# Patient Record
Sex: Male | Born: 1965 | Hispanic: No | Marital: Single | Smoking: Never smoker
Health system: Southern US, Community
[De-identification: ages and names within clinical notes are randomized; demographics above are authoritative.]

## PROBLEM LIST (undated history)

## (undated) DIAGNOSIS — E785 Hyperlipidemia, unspecified: Secondary | ICD-10-CM

## (undated) DIAGNOSIS — R079 Chest pain, unspecified: Secondary | ICD-10-CM

## (undated) DIAGNOSIS — N200 Calculus of kidney: Secondary | ICD-10-CM

## (undated) HISTORY — DX: Chest pain, unspecified: R07.9

## (undated) HISTORY — PX: NO PAST SURGERIES: SHX2092

---

## 2009-08-27 ENCOUNTER — Emergency Department (HOSPITAL_COMMUNITY): Admission: EM | Admit: 2009-08-27 | Discharge: 2009-08-27 | Payer: Self-pay | Admitting: Emergency Medicine

## 2010-07-21 LAB — URINALYSIS, ROUTINE W REFLEX MICROSCOPIC
Bilirubin Urine: NEGATIVE
Hgb urine dipstick: NEGATIVE
Nitrite: NEGATIVE
Urobilinogen, UA: 0.2 mg/dL (ref 0.0–1.0)

## 2010-07-21 LAB — URINE CULTURE: Colony Count: NO GROWTH

## 2012-02-11 ENCOUNTER — Encounter (HOSPITAL_COMMUNITY): Payer: Self-pay | Admitting: Emergency Medicine

## 2012-02-11 ENCOUNTER — Emergency Department (HOSPITAL_COMMUNITY)
Admission: EM | Admit: 2012-02-11 | Discharge: 2012-02-12 | Disposition: A | Discharge status: Home / Self Care | Payer: 59 | Attending: Emergency Medicine | Admitting: Emergency Medicine

## 2012-02-11 DIAGNOSIS — R109 Unspecified abdominal pain: Secondary | ICD-10-CM | POA: Insufficient documentation

## 2012-02-11 DIAGNOSIS — N210 Calculus in bladder: Secondary | ICD-10-CM | POA: Insufficient documentation

## 2012-02-11 DIAGNOSIS — R319 Hematuria, unspecified: Secondary | ICD-10-CM | POA: Insufficient documentation

## 2012-02-11 DIAGNOSIS — R112 Nausea with vomiting, unspecified: Secondary | ICD-10-CM | POA: Insufficient documentation

## 2012-02-11 DIAGNOSIS — N23 Unspecified renal colic: Secondary | ICD-10-CM

## 2012-02-11 LAB — COMPREHENSIVE METABOLIC PANEL
Albumin: 3.3 g/dL — ABNORMAL LOW (ref 3.5–5.2)
CO2: 28 mEq/L (ref 19–32)
Calcium: 8.5 mg/dL (ref 8.4–10.5)
GFR calc Af Amer: 83 mL/min — ABNORMAL LOW (ref >90)
Potassium: 3.5 mEq/L (ref 3.5–5.1)

## 2012-02-11 LAB — CBC
Hemoglobin: 14.3 g/dL (ref 13.0–17.0)
Platelets: 279 10*3/uL (ref 150–400)

## 2012-02-11 NOTE — ED Notes (Signed)
Pt alert, arrives from home, c/o left lower quad pain, blood in urine, onset was today, denies trauma or injury, denies hx of stones, resp even unlabored, skin pwd

## 2012-02-12 ENCOUNTER — Emergency Department (HOSPITAL_COMMUNITY): Payer: 59

## 2012-02-12 LAB — URINALYSIS, ROUTINE W REFLEX MICROSCOPIC
Bilirubin Urine: NEGATIVE
Nitrite: NEGATIVE
Specific Gravity, Urine: 1.03 (ref 1.005–1.030)
pH: 6.5 (ref 5.0–8.0)

## 2012-02-12 LAB — URINE MICROSCOPIC-ADD ON

## 2012-02-12 MED ORDER — IBUPROFEN 600 MG PO TABS: 600.0000 mg | ORAL_TABLET | Freq: Four times a day (QID) | ORAL | Status: DC | PRN

## 2012-02-12 MED ORDER — TAMSULOSIN HCL 0.4 MG PO CAPS: 0.4000 mg | ORAL_CAPSULE | ORAL | Status: DC

## 2012-02-12 MED ORDER — OXYCODONE-ACETAMINOPHEN 5-325 MG PO TABS: 1.0000 | ORAL_TABLET | ORAL | Status: DC | PRN

## 2012-02-12 MED ORDER — IBUPROFEN 800 MG PO TABS
800.0000 mg | ORAL_TABLET | Freq: Once | ORAL | Status: AC
  Administered 2012-02-12: 800 mg via ORAL
  Filled 2012-02-12: qty 1

## 2012-02-12 MED ORDER — TAMSULOSIN HCL 0.4 MG PO CAPS
0.4000 mg | ORAL_CAPSULE | Freq: Once | ORAL | Status: AC
  Administered 2012-02-12: 0.4 mg via ORAL
  Filled 2012-02-12: qty 1

## 2012-02-12 MED ORDER — OXYCODONE-ACETAMINOPHEN 5-325 MG PO TABS
1.0000 | ORAL_TABLET | Freq: Once | ORAL | Status: AC
  Administered 2012-02-12: 1 via ORAL
  Filled 2012-02-12: qty 1

## 2012-02-12 NOTE — ED Notes (Signed)
Patient transported to CT 

## 2012-02-12 NOTE — ED Provider Notes (Signed)
History     CSN: [id number]  Arrival date & time 02/11/12  2131   First MD Initiated Contact with Patient 02/12/12 0150      Chief Complaint  Patient presents with  . Hematuria    (Consider location/radiation/quality/duration/timing/severity/associated sxs/prior treatment) HPI Comments: Mr. Joel Fowler presents with his wife for acute onset left flank pain.  He describes an intermittent cramping pain that began in the mid to late afternoon and has increased in intensity.  He also noted that his urine appeared dark and blood tinged.  He has had nausea without vomiting.  He denies groin or testicular pain, scrotal swelling, penile discharges, abdominal pain, diarrhea, melena, or constipation.  He has never had any similar discomfort.  Patient is a 46 y.o. male presenting with hematuria. The history is provided by the patient. No language interpreter was used.  Hematuria This is a new problem. The current episode started today. The problem has been waxing and waning since onset. He describes the hematuria as gross hematuria. He reports no clotting in his urine stream. His pain is at a severity of 7/10. The pain is moderate. He describes his urine color as tea colored. Irritative symptoms do not include frequency, nocturia or urgency. Obstructive symptoms do not include dribbling, incomplete emptying, an intermittent stream, a slower stream, straining or a weak stream. Associated symptoms include flank pain and nausea. Pertinent negatives include no abdominal pain, chills, dysuria, facial swelling, fever, genital pain, hesitancy, inability to urinate or vomiting. He is sexually active.    History reviewed. No pertinent past medical history.  History reviewed. No pertinent past surgical history.  No family history on file.  History  Substance Use Topics  . Smoking status: Never Smoker   . Smokeless tobacco: Not on file  . Alcohol Use: No      Review of Systems  Constitutional: Negative  for fever and chills.  HENT: Negative for facial swelling.   Gastrointestinal: Positive for nausea. Negative for vomiting and abdominal pain.  Genitourinary: Positive for hematuria and flank pain. Negative for dysuria, hesitancy, urgency, frequency, incomplete emptying and nocturia.  All other systems reviewed and are negative.    Allergies  Review of patient's allergies indicates no known allergies.  Home Medications  No current outpatient prescriptions on file.  BP 113/73  Pulse 63  Temp 97.8 F (36.6 C) (Oral)  Resp 16  Wt 167 lb 8 oz (75.978 kg)  SpO2 97%  Physical Exam  Nursing note and vitals reviewed. Constitutional: He is oriented to person, place, and time. He appears well-developed and well-nourished. No distress.  HENT:  Head: Normocephalic.  Right Ear: External ear normal.  Left Ear: External ear normal.  Nose: Nose normal.  Mouth/Throat: Oropharynx is clear and moist. No oropharyngeal exudate.  Eyes: Conjunctivae normal are normal. Pupils are equal, round, and reactive to light. Right eye exhibits no discharge. Left eye exhibits no discharge. No scleral icterus.  Neck: Normal range of motion. Neck supple. No JVD present. No tracheal deviation present.  Cardiovascular: Normal rate, regular rhythm, normal heart sounds and intact distal pulses.  Exam reveals no gallop and no friction rub.   No murmur heard. Pulmonary/Chest: Effort normal and breath sounds normal. No stridor. No respiratory distress. He has no wheezes. He has no rales. He exhibits no tenderness.  Abdominal: Soft. Normal appearance and bowel sounds are normal. He exhibits no abdominal bruit, no ascites, no pulsatile midline mass and no mass. There is no hepatosplenomegaly. There is tenderness in  the left lower quadrant. There is CVA tenderness. There is no rigidity, no rebound, no guarding, no tenderness at McBurney's point and negative Murphy's sign.    Musculoskeletal: Normal range of motion. He  exhibits no edema and no tenderness.  Lymphadenopathy:    He has no cervical adenopathy.  Neurological: He is alert and oriented to person, place, and time.  Skin: Skin is warm and dry. No rash noted. He is not diaphoretic. No erythema. No pallor.  Psychiatric: He has a normal mood and affect. His behavior is normal.    ED Course  Procedures (including critical care time)  Labs Reviewed  URINALYSIS, ROUTINE W REFLEX MICROSCOPIC - Abnormal; Notable for the following:    Color, Urine AMBER (*)  BIOCHEMICALS MAY BE AFFECTED BY COLOR   APPearance CLOUDY (*)     Hgb urine dipstick LARGE (*)     Ketones, ur TRACE (*)     Protein, ur 30 (*)     Leukocytes, UA SMALL (*)     All other components within normal limits  COMPREHENSIVE METABOLIC PANEL - Abnormal; Notable for the following:    Glucose, Bld 103 (*)     Albumin 3.3 (*)     GFR calc non Af Amer 72 (*)     GFR calc Af Amer 83 (*)     All other components within normal limits  URINE MICROSCOPIC-ADD ON - Abnormal; Notable for the following:    Bacteria, UA MANY (*)     All other components within normal limits  CBC   Ct Abdomen Pelvis Wo Contrast  02/12/2012  *RADIOLOGY REPORT*  Clinical Data: Left-sided pain, hematuria  CT ABDOMEN AND PELVIS WITHOUT CONTRAST  Technique:  Multidetector CT imaging of the abdomen and pelvis was performed following the standard protocol without intravenous contrast.  Comparison: None.  Findings: Lung bases are essentially clear.  Unenhanced liver, spleen, pancreas, and adrenal glands within normal limits.  Gallbladder is underdistended.  No intrahepatic or extrahepatic ductal dilatation.  Kidneys are unremarkable.  No renal calculi or hydronephrosis.  No evidence of bowel obstruction.  Normal appendix.  No evidence of abdominal aortic aneurysm.  No abdominopelvic ascites.  No suspicious abdominopelvic lymphadenopathy.  Mild prostatomegaly, measuring 5.3 cm in transverse dimension.  No ureteral calculi.  2  mm calculus in the dependent bladder (series 2/image 71).  Visualized osseous structures are within normal limits.  IMPRESSION: 2 mm calculus in the dependent bladder.  No renal or ureteral calculi.  No hydronephrosis.  Mild prostatomegaly.   Original Report Authenticated By: Charline Bills, M.D.      No diagnosis found.    MDM  Pt presents for evaluation of left flank pain and hematuria.  He has a hx and exam concerning for a ureteral stone.  Labs were performed during the triage process that demonstrate hematuria wihout evidence of a UTI, nl electrolytes, no transaminitis, no leukocytosis, and a nl H&H + platelet count.  Pt treated with po ibuprofen, flomax, and percocet.  A CT scan has been performed which shows a 2mm stone in the bladder.  Pt's pain has improved.  Plan symptomatic care and close outpt f/u.  Pt has demonstrated no clinical evidence of peritonitis.        Tobin Chad, MD 02/12/12 0500

## 2012-02-12 NOTE — ED Notes (Signed)
MD at bedside. 

## 2012-04-02 DIAGNOSIS — N200 Calculus of kidney: Secondary | ICD-10-CM

## 2012-04-02 HISTORY — DX: Calculus of kidney: N20.0

## 2012-06-23 ENCOUNTER — Emergency Department (HOSPITAL_COMMUNITY): Payer: 59

## 2012-06-23 ENCOUNTER — Encounter (HOSPITAL_COMMUNITY): Payer: Self-pay | Admitting: Nurse Practitioner

## 2012-06-23 ENCOUNTER — Emergency Department (HOSPITAL_COMMUNITY)
Admission: EM | Admit: 2012-06-23 | Discharge: 2012-06-23 | Disposition: A | Discharge status: Home / Self Care | Payer: 59 | Attending: Emergency Medicine | Admitting: Emergency Medicine

## 2012-06-23 DIAGNOSIS — R109 Unspecified abdominal pain: Secondary | ICD-10-CM | POA: Insufficient documentation

## 2012-06-23 DIAGNOSIS — Z8639 Personal history of other endocrine, nutritional and metabolic disease: Secondary | ICD-10-CM | POA: Insufficient documentation

## 2012-06-23 DIAGNOSIS — Z862 Personal history of diseases of the blood and blood-forming organs and certain disorders involving the immune mechanism: Secondary | ICD-10-CM | POA: Insufficient documentation

## 2012-06-23 DIAGNOSIS — Z87442 Personal history of urinary calculi: Secondary | ICD-10-CM | POA: Insufficient documentation

## 2012-06-23 HISTORY — DX: Calculus of kidney: N20.0

## 2012-06-23 HISTORY — DX: Hyperlipidemia, unspecified: E78.5

## 2012-06-23 LAB — URINALYSIS, ROUTINE W REFLEX MICROSCOPIC
Bilirubin Urine: NEGATIVE
Ketones, ur: NEGATIVE mg/dL
Leukocytes, UA: NEGATIVE
Nitrite: NEGATIVE
Protein, ur: NEGATIVE mg/dL
Specific Gravity, Urine: 1.021 (ref 1.005–1.030)
pH: 6.5 (ref 5.0–8.0)

## 2012-06-23 LAB — CBC WITH DIFFERENTIAL/PLATELET
Eosinophils Absolute: 0.1 10*3/uL (ref 0.0–0.7)
Eosinophils Relative: 2 % (ref 0–5)
Neutro Abs: 2.3 10*3/uL (ref 1.7–7.7)
Neutrophils Relative %: 53 % (ref 43–77)

## 2012-06-23 LAB — BASIC METABOLIC PANEL
Calcium: 8.6 mg/dL (ref 8.4–10.5)
Creatinine, Ser: 1.11 mg/dL (ref 0.50–1.35)
Sodium: 136 mEq/L (ref 135–145)

## 2012-06-23 MED ORDER — ONDANSETRON HCL 4 MG/2ML IJ SOLN
4.0000 mg | Freq: Once | INTRAMUSCULAR | Status: AC
  Administered 2012-06-23: 4 mg via INTRAVENOUS
  Filled 2012-06-23: qty 2

## 2012-06-23 MED ORDER — MORPHINE SULFATE 4 MG/ML IJ SOLN
4.0000 mg | Freq: Once | INTRAMUSCULAR | Status: AC
  Administered 2012-06-23: 4 mg via INTRAVENOUS
  Filled 2012-06-23: qty 1

## 2012-06-23 MED ORDER — OXYCODONE-ACETAMINOPHEN 5-325 MG PO TABS: ORAL_TABLET | ORAL | Status: DC

## 2012-06-23 MED ORDER — SODIUM CHLORIDE 0.9 % IV SOLN: 20.0000 mL | INTRAVENOUS | Status: DC

## 2012-06-23 NOTE — ED Notes (Signed)
Pt aware of the need for a urine sample, urinal at beside.

## 2012-06-23 NOTE — ED Notes (Addendum)
Per pt: Pt is here for possible "kidney stones"; he reports right  back pain that radiates to the left. The pain started yesterday morning.  Denies vomiting or nausea, but reports dark malodorous urine.  Pt had kidney stones in Dec. 2013 for which he took oral medication and 1 stone passed.

## 2012-06-23 NOTE — ED Provider Notes (Signed)
  Medical screening examination/treatment/procedure(s) were performed by non-physician practitioner and as supervising physician I was immediately available for consultation/collaboration.    Tyric Rodeheaver, MD 06/23/12 1100 

## 2012-06-23 NOTE — ED Provider Notes (Signed)
History     CSN: [id number]  Arrival date & time 06/23/12  4098   First MD Initiated Contact with Patient 06/23/12 0800      Chief Complaint  Patient presents with  . Back Pain    Bilateral Pain- "Kidney Stones" per pt    (Consider location/radiation/quality/duration/timing/severity/associated sxs/prior treatment) HPI  Joel Fowler is a 47 y.o. male complaining of left flank pain starting yesterday. Patient has history of kidney stones he states that this feels similar. Pain is described as severe,9/10 radiates to right flank. Patient states he has a dark colored urine with foul odor episode of dysuria several days ago now resolved. Denies hematuria, fever, nausea vomiting, anterior abdominal pain. Patient has not followed with a urologist he has never had to have any intervention for his kidney stones to pass.   Past Medical History  Diagnosis Date  . Kidney stones 04/2012  . Hyperlipidemia     Pt states that it is under control without any meds.    History reviewed. No pertinent past surgical history.  Family History  Problem Relation Age of Onset  . Diabetes Mother   . Cancer Mother   . Diabetes Father   . Cancer Father     History  Substance Use Topics  . Smoking status: Never Smoker   . Smokeless tobacco: Not on file  . Alcohol Use: No      Review of Systems  Constitutional: Negative for fever.  Respiratory: Negative for shortness of breath.   Cardiovascular: Negative for chest pain.  Gastrointestinal: Negative for nausea, vomiting, abdominal pain and diarrhea.  Genitourinary: Positive for flank pain.  All other systems reviewed and are negative.    Allergies  Review of patient's allergies indicates no known allergies.  Home Medications   Current Outpatient Rx  Name  Route  Sig  Dispense  Refill  . ibuprofen (ADVIL,MOTRIN) 600 MG tablet   Oral   Take 1 tablet (600 mg total) by mouth every 6 (six) hours as needed for pain.   30 tablet   0   .  oxyCODONE-acetaminophen (PERCOCET) 5-325 MG per tablet   Oral   Take 1 tablet by mouth every 4 (four) hours as needed for pain.   20 tablet   0   . Tamsulosin HCl (FLOMAX) 0.4 MG CAPS   Oral   Take 1 capsule (0.4 mg total) by mouth 1 day or 1 dose.   7 capsule   0     BP 134/87  Temp(Src) 98.2 F (36.8 C) (Oral)  Resp 16  SpO2 100%  Physical Exam  Nursing note and vitals reviewed. Constitutional: He is oriented to person, place, and time. He appears well-developed and well-nourished. No distress.  Appears uncomfortable  HENT:  Head: Normocephalic.  Mouth/Throat: Oropharynx is clear and moist.  Eyes: Conjunctivae and EOM are normal. Pupils are equal, round, and reactive to light.  Cardiovascular: Normal rate.   Pulmonary/Chest: Effort normal and breath sounds normal. No stridor. No respiratory distress. He has no wheezes. He has no rales. He exhibits no tenderness.  Abdominal: Soft. Bowel sounds are normal. He exhibits no distension and no mass. There is no tenderness. There is no rebound and no guarding.  Genitourinary:  Left CVA tenderness  Musculoskeletal: Normal range of motion.  Neurological: He is alert and oriented to person, place, and time.  Psychiatric: He has a normal mood and affect.    ED Course  Procedures (including critical care time)  Labs Reviewed  URINALYSIS, ROUTINE W REFLEX MICROSCOPIC - Abnormal; Notable for the following:    APPearance CLOUDY (*)    All other components within normal limits  BASIC METABOLIC PANEL - Abnormal; Notable for the following:    GFR calc non Af Amer 78 (*)    All other components within normal limits  CBC WITH DIFFERENTIAL   Ct Abdomen Pelvis Wo Contrast  06/23/2012  *RADIOLOGY REPORT*  Clinical Data: Right flank pain, low back pain  CT ABDOMEN AND PELVIS WITHOUT CONTRAST  Technique:  Multidetector CT imaging of the abdomen and pelvis was performed following the standard protocol without intravenous contrast.   Comparison: 02/12/2012  Findings: Minimal basilar atelectasis.  Normal heart size.  No pericardial or pleural effusion.  Abdomen:  Very minimal decreased attenuation of the right kidney with trace perinephric strandy edema.  No associated hydronephrosis, pelviectasis, or hydroureter.  No visualized obstructing urinary tract or ureteral calculus.  Right kidney appears to be related to recent stone passage versus ascending urinary tract infection or pyelonephritis.  Left kidney and ureter demonstrate no acute process or obstruction.  Urinary bladder unremarkable.  Imaged portion of the liver, spleen, adrenal glands, pancreas, gallbladder, and biliary system are within normal limits for noncontrast study.  No abdominal free fluid, fluid collection, hemorrhage, abscess, or adenopathy.  Negative for bowel obstruction, dilatation, ileus, or free air.  Pelvis:  Normal appendix demonstrated.  No acute pelvic free fluid, fluid collection, hemorrhage, abscess, adenopathy, inguinal abnormality, or hernia.  No acute distal bowel process.  No osseous abnormality.  IMPRESSION: Diffuse left kidney hypoattenuation and trace perinephric strandy edema, suspect recent stone passage versus ascending urinary tract infection or pyelonephritis.  No obstructing urinary tract or ureteral calculus demonstrated.  Normal appendix  No other acute intra-abdominal or pelvic process   Original Report Authenticated By: Judie Petit. Shick, M.D.      1. Left flank pain       MDM  Urinalysis shows no signs of infection or hematuria. Based on this it is recent to proceed with CAT scan. CT shows his left kidney hypoattenuation and trace perinephric stranding it is read as recent stone passage versus ascending UTI her Pyelonephritis. His urinalysis is clean. Doubt aseptic pyelonephritis as he is afebrile with no nausea or vomiting. I will culture the urine and treat only pain.  Discussed case with attending who agrees with plan and stability to d/c to  home.    Pt verbalized understanding and agrees with care plan. Outpatient follow-up and return precautions given.    New Prescriptions   OXYCODONE-ACETAMINOPHEN (PERCOCET/ROXICET) 5-325 MG PER TABLET    1 to 2 tabs PO q6hrs  PRN for pain          Wynetta Emery, PA-C 06/23/12 1034

## 2012-09-29 ENCOUNTER — Emergency Department (HOSPITAL_COMMUNITY)
Admission: EM | Admit: 2012-09-29 | Discharge: 2012-09-29 | Disposition: A | Discharge status: Home / Self Care | Payer: Self-pay | Attending: Emergency Medicine | Admitting: Emergency Medicine

## 2012-09-29 ENCOUNTER — Encounter (HOSPITAL_COMMUNITY): Payer: Self-pay

## 2012-09-29 ENCOUNTER — Emergency Department (HOSPITAL_COMMUNITY): Payer: Self-pay

## 2012-09-29 DIAGNOSIS — Z87442 Personal history of urinary calculi: Secondary | ICD-10-CM | POA: Insufficient documentation

## 2012-09-29 DIAGNOSIS — R079 Chest pain, unspecified: Secondary | ICD-10-CM | POA: Insufficient documentation

## 2012-09-29 DIAGNOSIS — N200 Calculus of kidney: Secondary | ICD-10-CM | POA: Insufficient documentation

## 2012-09-29 DIAGNOSIS — Z862 Personal history of diseases of the blood and blood-forming organs and certain disorders involving the immune mechanism: Secondary | ICD-10-CM | POA: Insufficient documentation

## 2012-09-29 DIAGNOSIS — Z8639 Personal history of other endocrine, nutritional and metabolic disease: Secondary | ICD-10-CM | POA: Insufficient documentation

## 2012-09-29 LAB — CBC
HCT: 44.5 % (ref 39.0–52.0)
MCHC: 34.6 g/dL (ref 30.0–36.0)
MCV: 84.9 fL (ref 78.0–100.0)
Platelets: 242 10*3/uL (ref 150–400)
RDW: 12.5 % (ref 11.5–15.5)
WBC: 4.6 10*3/uL (ref 4.0–10.5)

## 2012-09-29 LAB — POCT I-STAT TROPONIN I: Troponin i, poc: 0 ng/mL (ref 0.00–0.08)

## 2012-09-29 LAB — COMPREHENSIVE METABOLIC PANEL
AST: 27 U/L (ref 0–37)
Albumin: 3.7 g/dL (ref 3.5–5.2)
BUN: 20 mg/dL (ref 6–23)
Creatinine, Ser: 1.1 mg/dL (ref 0.50–1.35)
Total Protein: 7.7 g/dL (ref 6.0–8.3)

## 2012-09-29 MED ORDER — OMEPRAZOLE 20 MG PO CPDR: 20.0000 mg | DELAYED_RELEASE_CAPSULE | Freq: Every day | ORAL | Status: AC

## 2012-09-29 MED ORDER — SODIUM CHLORIDE 0.9 % IV SOLN
1000.0000 mL | INTRAVENOUS | Status: DC
  Administered 2012-09-29: 1000 mL via INTRAVENOUS

## 2012-09-29 MED ORDER — ASPIRIN 81 MG PO CHEW
324.0000 mg | CHEWABLE_TABLET | Freq: Once | ORAL | Status: AC
  Administered 2012-09-29: 324 mg via ORAL
  Filled 2012-09-29: qty 4

## 2012-09-29 MED ORDER — NITROGLYCERIN 0.4 MG SL SUBL
0.4000 mg | SUBLINGUAL_TABLET | SUBLINGUAL | Status: DC | PRN
  Administered 2012-09-29 (×2): 0.4 mg via SUBLINGUAL
  Filled 2012-09-29: qty 25

## 2012-09-29 NOTE — ED Provider Notes (Signed)
History    CSN: [id number] Arrival date & time 09/29/12  0945 First MD Initiated Contact with Patient 09/29/12 [phone]      Chief Complaint  Patient presents with  . Chest Pain    HPI The patient presents to the emergency room with complaints of central dull chest pain since yesterday afternoon. He states the symptoms are primarily a pounding in his chest associated with a skipping beat sensation. Symptoms never completely resolved but not much better until last evening when he started back up again and greater intensity.   The pain radiates to his left arm. He does not have any trouble with shortness of breath or nausea. He does not have any vomiting or abdominal pain. He denies any burping or belching. She does not have any history of heart disease he does not have history of high blood pressure and he does not smoke. There is a family history of heart disease. Past Medical History  Diagnosis Date  . Kidney stones 04/2012  . Hyperlipidemia     Pt states that it is under control without any meds.  . Kidney stone   . Kidney stone   . Kidney stone     History reviewed. No pertinent past surgical history.  Family History  Problem Relation Age of Onset  . Diabetes Mother   . Cancer Mother   . Diabetes Father   . Cancer Father     History  Substance Use Topics  . Smoking status: Never Smoker   . Smokeless tobacco: Not on file  . Alcohol Use: No      Review of Systems  All other systems reviewed and are negative.    Allergies  Review of patient's allergies indicates no known allergies.  Home Medications   Current Outpatient Rx  Name  Route  Sig  Dispense  Refill  . omeprazole (PRILOSEC) 20 MG capsule   Oral   Take 1 capsule (20 mg total) by mouth daily.   14 capsule   0     BP 101/64  Pulse 73  Temp(Src) 98.7 F (37.1 C) (Oral)  Resp 16  SpO2 100%  Physical Exam  Nursing note and vitals reviewed. Constitutional: He appears well-developed and  well-nourished. No distress.  HENT:  Head: Normocephalic and atraumatic.  Right Ear: External ear normal.  Left Ear: External ear normal.  Eyes: Conjunctivae are normal. Right eye exhibits no discharge. Left eye exhibits no discharge. No scleral icterus.  Neck: Neck supple. No tracheal deviation present.  Cardiovascular: Normal rate, regular rhythm and intact distal pulses.   Pulmonary/Chest: Effort normal and breath sounds normal. No stridor. No respiratory distress. He has no wheezes. He has no rales.  Abdominal: Soft. Bowel sounds are normal. He exhibits no distension. There is no tenderness. There is no rebound and no guarding.  Musculoskeletal: He exhibits no edema and no tenderness.  Neurological: He is alert. He has normal strength. No sensory deficit. Cranial nerve deficit:  no gross defecits noted. He exhibits normal muscle tone. He displays no seizure activity. Coordination normal.  Skin: Skin is warm and dry. No rash noted.  Psychiatric: He has a normal mood and affect.    ED Course  Procedures (including critical care time)  Rate: 88  Rhythm: normal sinus rhythm  QRS Axis: normal  Intervals: normal  ST/T Wave abnormalities: normal  Conduction Disutrbances:none  Narrative Interpretation: nl  Old EKG Reviewed: none available  Labs Reviewed  COMPREHENSIVE METABOLIC PANEL - Abnormal; Notable for the  following:    GFR calc non Af Amer 78 (*)    All other components within normal limits  CBC  POCT I-STAT TROPONIN I   Dg Chest 2 View  09/29/2012   *RADIOLOGY REPORT*  Clinical Data: 1-day history of mid chest pain.  CHEST - 2 VIEW  Comparison: None.  Findings: Cardiomediastinal silhouette unremarkable.  Lungs clear. Bronchovascular markings normal.  Pulmonary vascularity normal.  No pleural effusions.  No pneumothorax.  Visualized bony thorax intact.  IMPRESSION: Normal examination.   Original Report Authenticated By: Hulan Saas, M.D.     1. Chest pain       MDM   The patient's evaluation in the emergency department is reassuring. The patient is otherwise healthy with no risk factors other than hyperlipidemia. The symptoms have been constant throughout the night and his troponin is normal. I doubt that this is related to any acute cardiac issue. the patient is low risk and i  feel that further outpatient evaluation is reasonable  .  Findings were discussed with the patient and his family. They agree with this plan.        Celene Kras, MD 09/29/12 [phone]

## 2012-09-29 NOTE — ED Notes (Signed)
He c/o central chest "dull pain" varying in intensity, but never gone, since yesterday midday.  He also c/o generalized weakness.  He denies fever/cough/nor any other sign of recent illness.  His skin is normal, warm and dry and he is breathing normally.  He states his father passed from CAD and his mother has cardiomegaly with surgical intervention.

## 2012-09-29 NOTE — ED Notes (Signed)
[id number]<AV> Expected date:<BR> Expected time:<BR> Means of arrival:<BR> Comments:<BR> triage

## 2015-08-12 ENCOUNTER — Emergency Department (HOSPITAL_COMMUNITY): Payer: 59

## 2015-08-12 ENCOUNTER — Encounter (HOSPITAL_COMMUNITY): Payer: Self-pay | Admitting: *Deleted

## 2015-08-12 ENCOUNTER — Emergency Department (HOSPITAL_COMMUNITY)
Admission: EM | Admit: 2015-08-12 | Discharge: 2015-08-12 | Disposition: A | Discharge status: Home / Self Care | Payer: 59 | Attending: Emergency Medicine | Admitting: Emergency Medicine

## 2015-08-12 DIAGNOSIS — R079 Chest pain, unspecified: Secondary | ICD-10-CM | POA: Insufficient documentation

## 2015-08-12 LAB — BASIC METABOLIC PANEL
Anion gap: 9 (ref 5–15)
BUN: 15 mg/dL (ref 6–20)
CHLORIDE: 106 mmol/L (ref 101–111)
CO2: 24 mmol/L (ref 22–32)
CREATININE: 1.17 mg/dL (ref 0.61–1.24)
Calcium: 8.8 mg/dL — ABNORMAL LOW (ref 8.9–10.3)
GFR calc Af Amer: >60 mL/min (ref >60)
GFR calc non Af Amer: >60 mL/min (ref >60)
GLUCOSE: 89 mg/dL (ref 65–99)
Potassium: 4 mmol/L (ref 3.5–5.1)
SODIUM: 139 mmol/L (ref 135–145)

## 2015-08-12 LAB — CBC
HCT: 42.6 % (ref 39.0–52.0)
Hemoglobin: 14.1 g/dL (ref 13.0–17.0)
MCH: 28.5 pg (ref 26.0–34.0)
MCHC: 33.1 g/dL (ref 30.0–36.0)
MCV: 86.2 fL (ref 78.0–100.0)
PLATELETS: 234 10*3/uL (ref 150–400)
RBC: 4.94 MIL/uL (ref 4.22–5.81)
RDW: 12.5 % (ref 11.5–15.5)
WBC: 5.4 10*3/uL (ref 4.0–10.5)

## 2015-08-12 LAB — I-STAT TROPONIN, ED: Troponin i, poc: 0 ng/mL (ref 0.00–0.08)

## 2015-08-12 NOTE — ED Notes (Signed)
Pt didn't want to wait any longer so left AMA.

## 2015-08-12 NOTE — ED Notes (Signed)
Pt c/o Mid radiating CP to mid upper back onset yesterday, worsening today, pt c/o SOB & nausea, pt denies v/d, pt A&O x4

## 2015-08-14 ENCOUNTER — Encounter: Payer: Self-pay | Admitting: Internal Medicine

## 2015-09-09 ENCOUNTER — Telehealth: Payer: Self-pay

## 2015-09-09 ENCOUNTER — Ambulatory Visit (AMBULATORY_SURGERY_CENTER): Payer: Self-pay

## 2015-09-09 VITALS — Ht 68.0 in | Wt 172.4 lb

## 2015-09-09 DIAGNOSIS — Z1211 Encounter for screening for malignant neoplasm of colon: Secondary | ICD-10-CM

## 2015-09-09 MED ORDER — NA SULFATE-K SULFATE-MG SULF 17.5-3.13-1.6 GM/177ML PO SOLN: ORAL | Status: AC

## 2015-09-09 NOTE — Progress Notes (Signed)
Per pt, no allergies to soy or egg products.Pt not taking any weight loss meds or using  O2 at home.  Pt was seen in ER on 08/12/15.Pt is currently wearing a Holter monitor for 30 days. Note sent to Dr Marina GoodellPerry regarding if pt needs an office visit or reschedule the colon on 09/23/15.

## 2015-09-09 NOTE — Telephone Encounter (Signed)
[facility] [address] [place] [phone]  09/09/2015   RE: Joel JamesLuis N Fowler DOB: 03/02/1966 MRN: [id number]   Dear Vick FreesFareed Al-Khori MD,   We have scheduled the above patient for an endoscopic procedure. Our records show that he was seen in the ED on 08/12/15.He is currently wearing a Holter monitor for 30 days.  Please notify us regarding cardiac clearance prior to the procedure, which is scheduled for 09/23/15 with Dr Yancey FlemingsJohn Perry  Please fax back/ or route the completed form [facility] at [phone]  Sincerely,    Dr Yancey FlemingsJohn Perry    Faxed to Dr Vick FreesFareed Al-Khori MD at [phone]

## 2015-09-09 NOTE — Telephone Encounter (Signed)
Called pt, but unable to leave message. Will try again later.Will send cardiac clearance letter to his cardiologist.

## 2015-09-09 NOTE — Telephone Encounter (Signed)
Dr Waverly FerrariPerry,This pt is scheduled for a colonoscopy with you on 09/23/15.He was seen in the ER on 08/12/15 due to chest pain.The pt states the EKG and lab work was normal, but he is wearing a Holter monitor for 30 days.Do you need to see him in the office first or should the colonoscopy be scheduled for a later date.Please advise? Thanks

## 2015-09-09 NOTE — Telephone Encounter (Signed)
He can proceed with the procedure as long as he has cardiology clearance first. He needs to contact us with that clearance or even better his cardiologist can provide documentation. Please follow-up on this for us.

## 2015-09-10 ENCOUNTER — Telehealth: Payer: Self-pay

## 2015-09-10 NOTE — Telephone Encounter (Signed)
Dr. Lupita ShutterPerry Janis sent you a note earlier about this pt being on a holter monitor and cardiac clearance. She sent the cardiologist a note yesterday requesting cardiac clearance. Pt will not complete the 30 days with the holter monitor until 10/01/15. Colon is scheduled for 09/23/15, not sure if cardiac clearance can be obtained until monitor complete. This is for a screening colon. Please advise.

## 2015-09-10 NOTE — Telephone Encounter (Signed)
Spoke with pt and he is aware and knows to contact the office to reschedule colon when finished with cardiologist.

## 2015-09-10 NOTE — Telephone Encounter (Signed)
Cancel completely at this point. The patient can reschedule in the future after cardiac issues sorted out and if he remains appropriate candidate. Thanks

## 2015-09-10 NOTE — Telephone Encounter (Signed)
-----   Message from Heloise PurpuraJanis M Breece, RN sent at 09/09/2015  6:37 PM EDT ----- Bonita QuinLinda, See message from 09/09/15.Per Dr Marina GoodellPerry, this pt needs cardiac clearance prior to his colon on 09/23/15.I faxed a cardiac clearance letter to Dr Vick FreesFareed Al-Khori in [facility].Can you please follow-up on this.Thanks for your help! Cherylann RatelJanis

## 2015-09-16 NOTE — Telephone Encounter (Signed)
Pt was notified that our office needs cardiac clearance prior to his colonoscopy on 09/23/15. A letter was sent to pt's cardiologist for medical clearance.Colon on 09/23/15 was cancelled.

## 2015-09-23 ENCOUNTER — Encounter: Payer: 59 | Admitting: Internal Medicine

## 2017-08-09 ENCOUNTER — Ambulatory Visit (HOSPITAL_COMMUNITY)
Admission: EM | Admit: 2017-08-09 | Discharge: 2017-08-09 | Disposition: A | Discharge status: Home / Self Care | Payer: Self-pay | Attending: Family Medicine | Admitting: Family Medicine

## 2017-08-09 ENCOUNTER — Other Ambulatory Visit: Payer: Self-pay

## 2017-08-09 ENCOUNTER — Encounter (HOSPITAL_COMMUNITY): Payer: Self-pay | Admitting: Emergency Medicine

## 2017-08-09 DIAGNOSIS — J3489 Other specified disorders of nose and nasal sinuses: Secondary | ICD-10-CM

## 2017-08-09 DIAGNOSIS — R6889 Other general symptoms and signs: Secondary | ICD-10-CM

## 2017-08-09 MED ORDER — AMOXICILLIN-POT CLAVULANATE 875-125 MG PO TABS: 1.0000 | ORAL_TABLET | Freq: Two times a day (BID) | ORAL | 0 refills | Status: AC

## 2017-08-09 MED ORDER — OSELTAMIVIR PHOSPHATE 75 MG PO CAPS: 75.0000 mg | ORAL_CAPSULE | Freq: Two times a day (BID) | ORAL | 0 refills | Status: AC

## 2017-08-09 NOTE — ED Provider Notes (Signed)
[facility]    CSN: [id number] Arrival date & time: 08/09/17  1021     History   Chief Complaint Chief Complaint  Patient presents with  . URI    HPI Joel Fowler is a 52 y.o. male.   Have been feeling sick with URI symptoms for the past 5 days. Endorses fever for the last 5 days with highest being 102.0 and this was yesterday. Other associated symptoms are chills, body aches, fatigue, headache, nausea, poor appetite, otalgia, nasal congestion. Also endorses some frontal sinus pain. Patient have been taking ibuprofen at home. Denies sick contact exposure. Denies shortness of breath, chest pain, dizziness, vomiting or abdominal pain.   The history is provided by the patient.    Past Medical History:  Diagnosis Date  . Chest pain    In ER 08/12/15/EKG normal!  . Hyperlipidemia    Pt states that it is under control without any meds.  . Kidney stones 04/2012    Patient Active Problem List   Diagnosis Date Noted  . Kidney stone     Past Surgical History:  Procedure Laterality Date  . NO PAST SURGERIES         Home Medications    Prior to Admission medications   Medication Sig Start Date End Date Taking? Authorizing Provider  amoxicillin-clavulanate (AUGMENTIN) 875-125 MG tablet Take 1 tablet by mouth 2 (two) times daily for 7 days. 08/09/17 08/16/17  Lucia EstelleZheng, Fabiha Rougeau, NP  Na Sulfate-K Sulfate-Mg Sulf (SUPREP BOWEL PREP) SOLN Suprep as directed/no substitutions 09/09/15   Hilarie FredricksonPerry, John N, MD  omeprazole (PRILOSEC) 20 MG capsule Take 1 capsule (20 mg total) by mouth daily. Patient not taking: Reported on 09/09/2015 09/29/12   Linwood DibblesKnapp, Jon, MD  oseltamivir (TAMIFLU) 75 MG capsule Take 1 capsule (75 mg total) by mouth every 12 (twelve) hours for 5 days. 08/09/17 08/14/17  Lucia EstelleZheng, Ardis Lawley, NP    Family History Family History  Problem Relation Age of Onset  . Heart disease Mother   . Cancer Father   . Diabetes Brother   . Breast cancer Maternal Grandmother   . Diabetes  Brother   . Throat cancer Brother     Social History Social History   Tobacco Use  . Smoking status: Never Smoker  . Smokeless tobacco: Never Used  Substance Use Topics  . Alcohol use: No    Alcohol/week: 0.0 oz  . Drug use: No     Allergies   Patient has no known allergies.   Review of Systems Review of Systems  Constitutional:       See HPI     Physical Exam Triage Vital Signs ED Triage Vitals  Enc Vitals Group     BP 08/09/17 1113 114/81     Pulse Rate 08/09/17 1113 (!) 107     Resp --      Temp 08/09/17 1113 98.7 F (37.1 C)     Temp Source 08/09/17 1113 Oral     SpO2 08/09/17 1113 98 %     Weight --      Height --      Head Circumference --      Peak Flow --      Pain Score 08/09/17 1112 7     Pain Loc --      Pain Edu? --      Excl. in GC? --    No data found.  Updated Vital Signs BP 114/81 (BP Location: Right Arm)   Pulse (!) 107  Temp 98.7 F (37.1 C) (Oral)   SpO2 98%   Physical Exam  Constitutional: He is oriented to person, place, and time. He appears well-developed.  Appears tired but no lethargy noted  HENT:  Head: Normocephalic and atraumatic.  Right Ear: External ear normal.  Left Ear: External ear normal.  Nose: Nose normal.  Mouth/Throat: Oropharynx is clear and moist. No oropharyngeal exudate.  TM pearly gray with no erythema.  Frontal and maxillary sinuses are both tender to percuss.  Eyes: Pupils are equal, round, and reactive to light. Conjunctivae are normal.  Neck: Normal range of motion. Neck supple.  Cardiovascular: Normal rate, regular rhythm and normal heart sounds.  No murmur heard. Pulmonary/Chest: Effort normal and breath sounds normal. He has no wheezes.  Abdominal: Soft. Bowel sounds are normal. There is no tenderness.  Lymphadenopathy:    He has no cervical adenopathy.  Neurological: He is alert and oriented to person, place, and time.  Skin: Skin is warm and dry.  Nursing note and vitals reviewed.   UC  Treatments / Results  Labs (all labs ordered are listed, but only abnormal results are displayed) Labs Reviewed - No data to display  EKG None Radiology No results found.  Procedures Procedures (including critical care time)  Medications Ordered in UC Medications - No data to display   Initial Impression / Assessment and Plan / UC Course  I have reviewed the triage vital signs and the nursing notes.  Pertinent labs & imaging results that were available during my care of the patient were reviewed by me and considered in my medical decision making (see chart for details).  Final Clinical Impressions(s) / UC Diagnoses   Final diagnoses:  Flu-like symptoms  Sinus pain   Clinical presenstation concerning for influenza.  Will treat with Tamiflu twice a day times 5 days. Concerning for sinus infection, will treat empirically for sinusitis with Augmentin twice a day times 7 days. Encourage rest and allowed hydration. Advised to return or follow with primary care provider for worsening symptoms or no improvement.   ED Discharge Orders        Ordered    amoxicillin-clavulanate (AUGMENTIN) 875-125 MG tablet  2 times daily     08/09/17 1143    oseltamivir (TAMIFLU) 75 MG capsule  Every 12 hours     08/09/17 1143     Controlled Substance Prescriptions [place] Controlled Substance Registry consulted? Not Applicable   Lucia Estelle, NP 08/09/17 1148

## 2017-08-09 NOTE — ED Triage Notes (Signed)
Pt reports fever, headache, bilateral ear ache, nasal congestion, and sore throat x5 days.

## 2018-01-27 ENCOUNTER — Encounter (HOSPITAL_COMMUNITY): Payer: Self-pay | Admitting: *Deleted

## 2018-01-27 ENCOUNTER — Emergency Department (HOSPITAL_COMMUNITY): Payer: Self-pay

## 2018-01-27 ENCOUNTER — Emergency Department (HOSPITAL_COMMUNITY)
Admission: EM | Admit: 2018-01-27 | Discharge: 2018-01-27 | Disposition: A | Discharge status: Home / Self Care | Payer: Self-pay | Attending: Emergency Medicine | Admitting: Emergency Medicine

## 2018-01-27 DIAGNOSIS — R42 Dizziness and giddiness: Secondary | ICD-10-CM | POA: Insufficient documentation

## 2018-01-27 DIAGNOSIS — R0789 Other chest pain: Secondary | ICD-10-CM | POA: Insufficient documentation

## 2018-01-27 LAB — BASIC METABOLIC PANEL
Anion gap: 7 (ref 5–15)
BUN: 22 mg/dL — ABNORMAL HIGH (ref 6–20)
CO2: 28 mmol/L (ref 22–32)
Calcium: 8.9 mg/dL (ref 8.9–10.3)
Chloride: 103 mmol/L (ref 98–111)
Creatinine, Ser: 1.25 mg/dL — ABNORMAL HIGH (ref 0.61–1.24)
GFR calc Af Amer: >60 mL/min (ref >60)
GFR calc non Af Amer: >60 mL/min (ref >60)
Glucose, Bld: 132 mg/dL — ABNORMAL HIGH (ref 70–99)
Potassium: 4.6 mmol/L (ref 3.5–5.1)
Sodium: 138 mmol/L (ref 135–145)

## 2018-01-27 LAB — CBC
HCT: 45.4 % (ref 39.0–52.0)
Hemoglobin: 15.4 g/dL (ref 13.0–17.0)
MCH: 29.8 pg (ref 26.0–34.0)
MCHC: 33.9 g/dL (ref 30.0–36.0)
MCV: 87.8 fL (ref 78.0–100.0)
Platelets: 299 10*3/uL (ref 150–400)
RBC: 5.17 MIL/uL (ref 4.22–5.81)
RDW: 12.5 % (ref 11.5–15.5)
WBC: 5.7 10*3/uL (ref 4.0–10.5)

## 2018-01-27 LAB — I-STAT TROPONIN, ED: Troponin i, poc: 0 ng/mL (ref 0.00–0.08)

## 2018-01-27 MED ORDER — MECLIZINE HCL 25 MG PO TABS
25.0000 mg | ORAL_TABLET | Freq: Once | ORAL | Status: AC
  Administered 2018-01-27: 25 mg via ORAL
  Filled 2018-01-27: qty 1

## 2018-01-27 MED ORDER — MECLIZINE HCL 25 MG PO TABS: 25.0000 mg | ORAL_TABLET | Freq: Three times a day (TID) | ORAL | 0 refills | Status: AC | PRN

## 2018-01-27 NOTE — ED Notes (Signed)
ED Provider at bedside. 

## 2018-01-27 NOTE — ED Notes (Signed)
Patient given discharge teaching and verbalized understanding. Patient ambulated out of ED with a steady gait. 

## 2018-01-27 NOTE — ED Triage Notes (Signed)
Pt complains of chest pain for the past couple weeks, nausea/dizziness since this morning. Pt states dizziness is worse when standing up.

## 2018-02-06 NOTE — ED Provider Notes (Signed)
[facility]-EMERGENCY DEPT Provider Note   CSN: [id number] Arrival date & time: 01/27/18  0848     History   Chief Complaint Chief Complaint  Patient presents with  . Chest Pain  . Dizziness    HPI Joel Fowler is a 52 y.o. male.  HPI  52 year old male with 2 complaints.  Primarily dizziness.  Onset this morning while at work.  Associated with nausea. Felt very off balance when getting up. Felt like room spinning. Better at rest.   He has also been having intermittent chest pain for the past 2 weeks.  Describes a sharp pain in the center wrist chest which will simply come and go randomly.  Last a couple seconds and then go away.  He denies any other associated symptoms.  Past Medical History:  Diagnosis Date  . Chest pain    In ER 08/12/15/EKG normal!  . Hyperlipidemia    Pt states that it is under control without any meds.  . Kidney stones 04/2012    Patient Active Problem List   Diagnosis Date Noted  . Kidney stone     Past Surgical History:  Procedure Laterality Date  . NO PAST SURGERIES          Home Medications    Prior to Admission medications   Medication Sig Start Date End Date Taking? Authorizing Provider  meclizine (ANTIVERT) 25 MG tablet Take 1 tablet (25 mg total) by mouth 3 (three) times daily as needed for dizziness or nausea. 01/27/18   Raeford Razor, MD  Na Sulfate-K Sulfate-Mg Sulf (SUPREP BOWEL PREP) Rolin Barry as directed/no substitutions Patient not taking: Reported on 01/27/2018 09/09/15   Hilarie Fredrickson, MD  omeprazole (PRILOSEC) 20 MG capsule Take 1 capsule (20 mg total) by mouth daily. Patient not taking: Reported on 09/09/2015 09/29/12   Linwood Dibbles, MD    Family History Family History  Problem Relation Age of Onset  . Heart disease Mother   . Cancer Father   . Diabetes Brother   . Breast cancer Maternal Grandmother   . Diabetes Brother   . Throat cancer Brother     Social History Social History   Tobacco  Use  . Smoking status: Never Smoker  . Smokeless tobacco: Never Used  Substance Use Topics  . Alcohol use: No    Alcohol/week: 0.0 standard drinks  . Drug use: No     Allergies   Patient has no known allergies.   Review of Systems Review of Systems  All systems reviewed and negative, other than as noted in HPI.  Physical Exam Updated Vital Signs BP (!) 129/95   Pulse 82   Temp (!) 97.5 F (36.4 C) (Oral)   Resp 20   SpO2 98%   Physical Exam  Constitutional: He is oriented to person, place, and time. He appears well-developed and well-nourished. No distress.  HENT:  Head: Normocephalic and atraumatic.  Eyes: Conjunctivae are normal. Right eye exhibits no discharge. Left eye exhibits no discharge.  Neck: Neck supple.  Cardiovascular: Normal rate, regular rhythm and normal heart sounds. Exam reveals no gallop and no friction rub.  No murmur heard. Pulmonary/Chest: Effort normal and breath sounds normal. No respiratory distress.  Abdominal: Soft. He exhibits no distension. There is no tenderness.  Musculoskeletal: He exhibits no edema or tenderness.  Lower extremities symmetric as compared to each other. No calf tenderness. Negative Homan's. No palpable cords.   Neurological: He is alert and oriented to person, place, and  time. No cranial nerve deficit. He exhibits normal muscle tone. Coordination normal.  Good finger to nose b/l  Skin: Skin is warm and dry.  Psychiatric: He has a normal mood and affect. His behavior is normal. Thought content normal.  Nursing note and vitals reviewed.    ED Treatments / Results  Labs (all labs ordered are listed, but only abnormal results are displayed) Labs Reviewed  BASIC METABOLIC PANEL - Abnormal; Notable for the following components:      Result Value   Glucose, Bld 132 (*)    BUN 22 (*)    Creatinine, Ser 1.25 (*)    All other components within normal limits  CBC  I-STAT TROPONIN, ED    EKG EKG  Interpretation  Date/Time:  Friday January 27 2018 08:54:13 EDT Ventricular Rate:  80 PR Interval:    QRS Duration: 86 QT Interval:  361 QTC Calculation: 417 R Axis:   70 Text Interpretation:  Sinus rhythm ST elev, probable normal early repol pattern similar to previous from 08/12/2015 Confirmed by Raeford Razor [phone]) on 01/27/2018 9:00:44 AM Also confirmed by Raeford Razor [phone]), editor Sheppard Evens ([id number])  on 01/27/2018 3:44:25 PM   Radiology No results found.  Procedures Procedures (including critical care time)  Medications Ordered in ED Medications  meclizine (ANTIVERT) tablet 25 mg (25 mg Oral Given 01/27/18 1009)     Initial Impression / Assessment and Plan / ED Course  I have reviewed the triage vital signs and the nursing notes.  Pertinent labs & imaging results that were available during my care of the patient were reviewed by me and considered in my medical decision making (see chart for details).     52yM with CP and what sounds like vertigo. Suspect different processes. CP sounds atypical for ACS. Brief sharp pain last minutes at most and more frequent at night. No association with exertion. Denies dyspnea, diaphoresis, etc. EKG similar to prior. Labs/CXR unrevealing. Doubt PE. Consider GERD with increased frequency at night but seems a little atypical for this too.  Vertigo likely peripheral. Reproducible. Positional. Non-focal neuro exam. No HA or visual complaints. Plan symptomatic tx.   It has been determined that no acute conditions requiring further emergency intervention are present at this time. The patient has been advised of the diagnosis and plan. I reviewed any labs and imaging including any potential incidental findings. We have discussed signs and symptoms that warrant return to the ED and they are listed in the discharge instructions.    Final Clinical Impressions(s) / ED Diagnoses   Final diagnoses:  Atypical chest pain  Vertigo     ED Discharge Orders         Ordered    meclizine (ANTIVERT) 25 MG tablet  3 times daily PRN     01/27/18 1026           Raeford Razor, MD 02/06/18 [phone]

## 2019-03-14 IMAGING — CR DG CHEST 2V
2 series · 2 of 2 positions shown · non-contrast
Comparison: 08/12/2015

CLINICAL DATA: Chest pain

EXAM:
CHEST - 2 VIEW

[w chest pa]
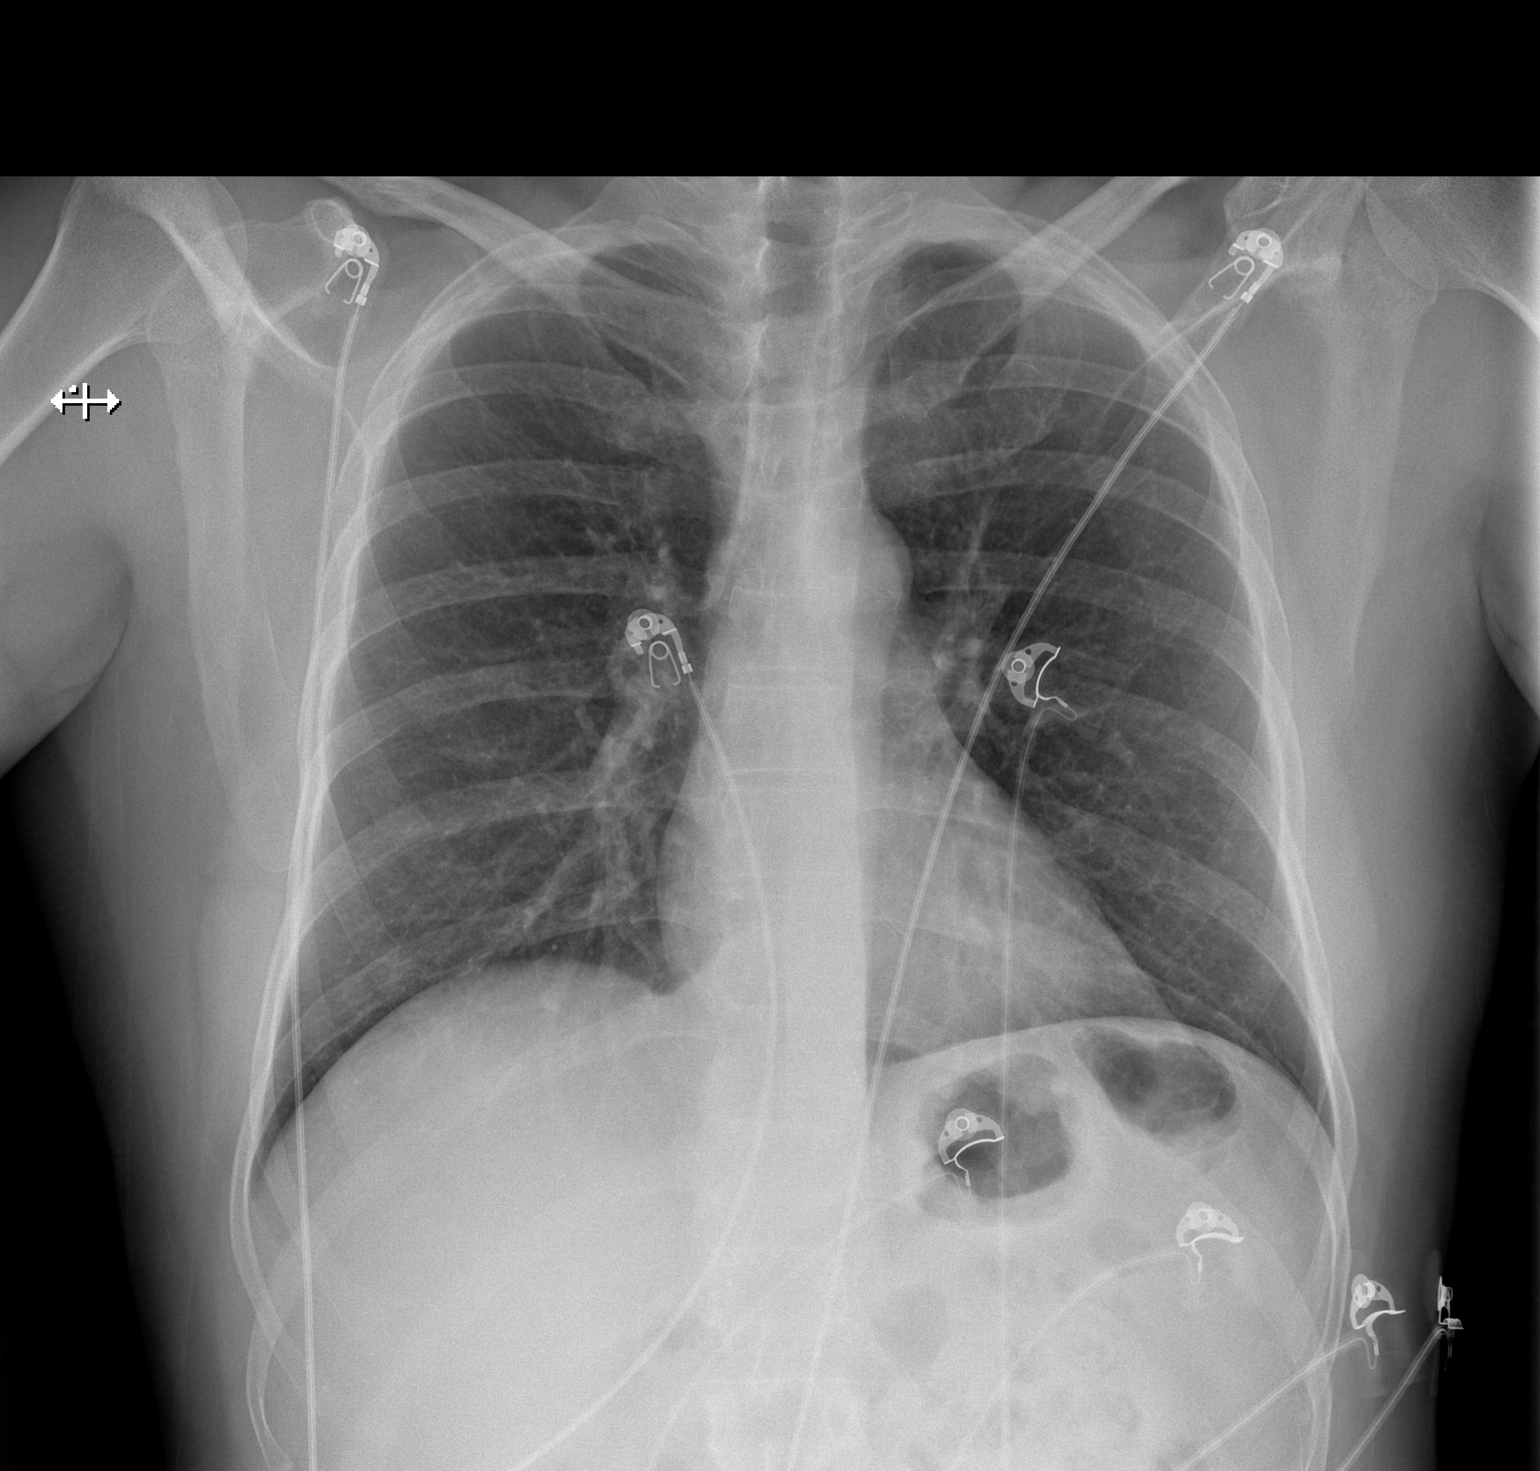

[w chest lat]
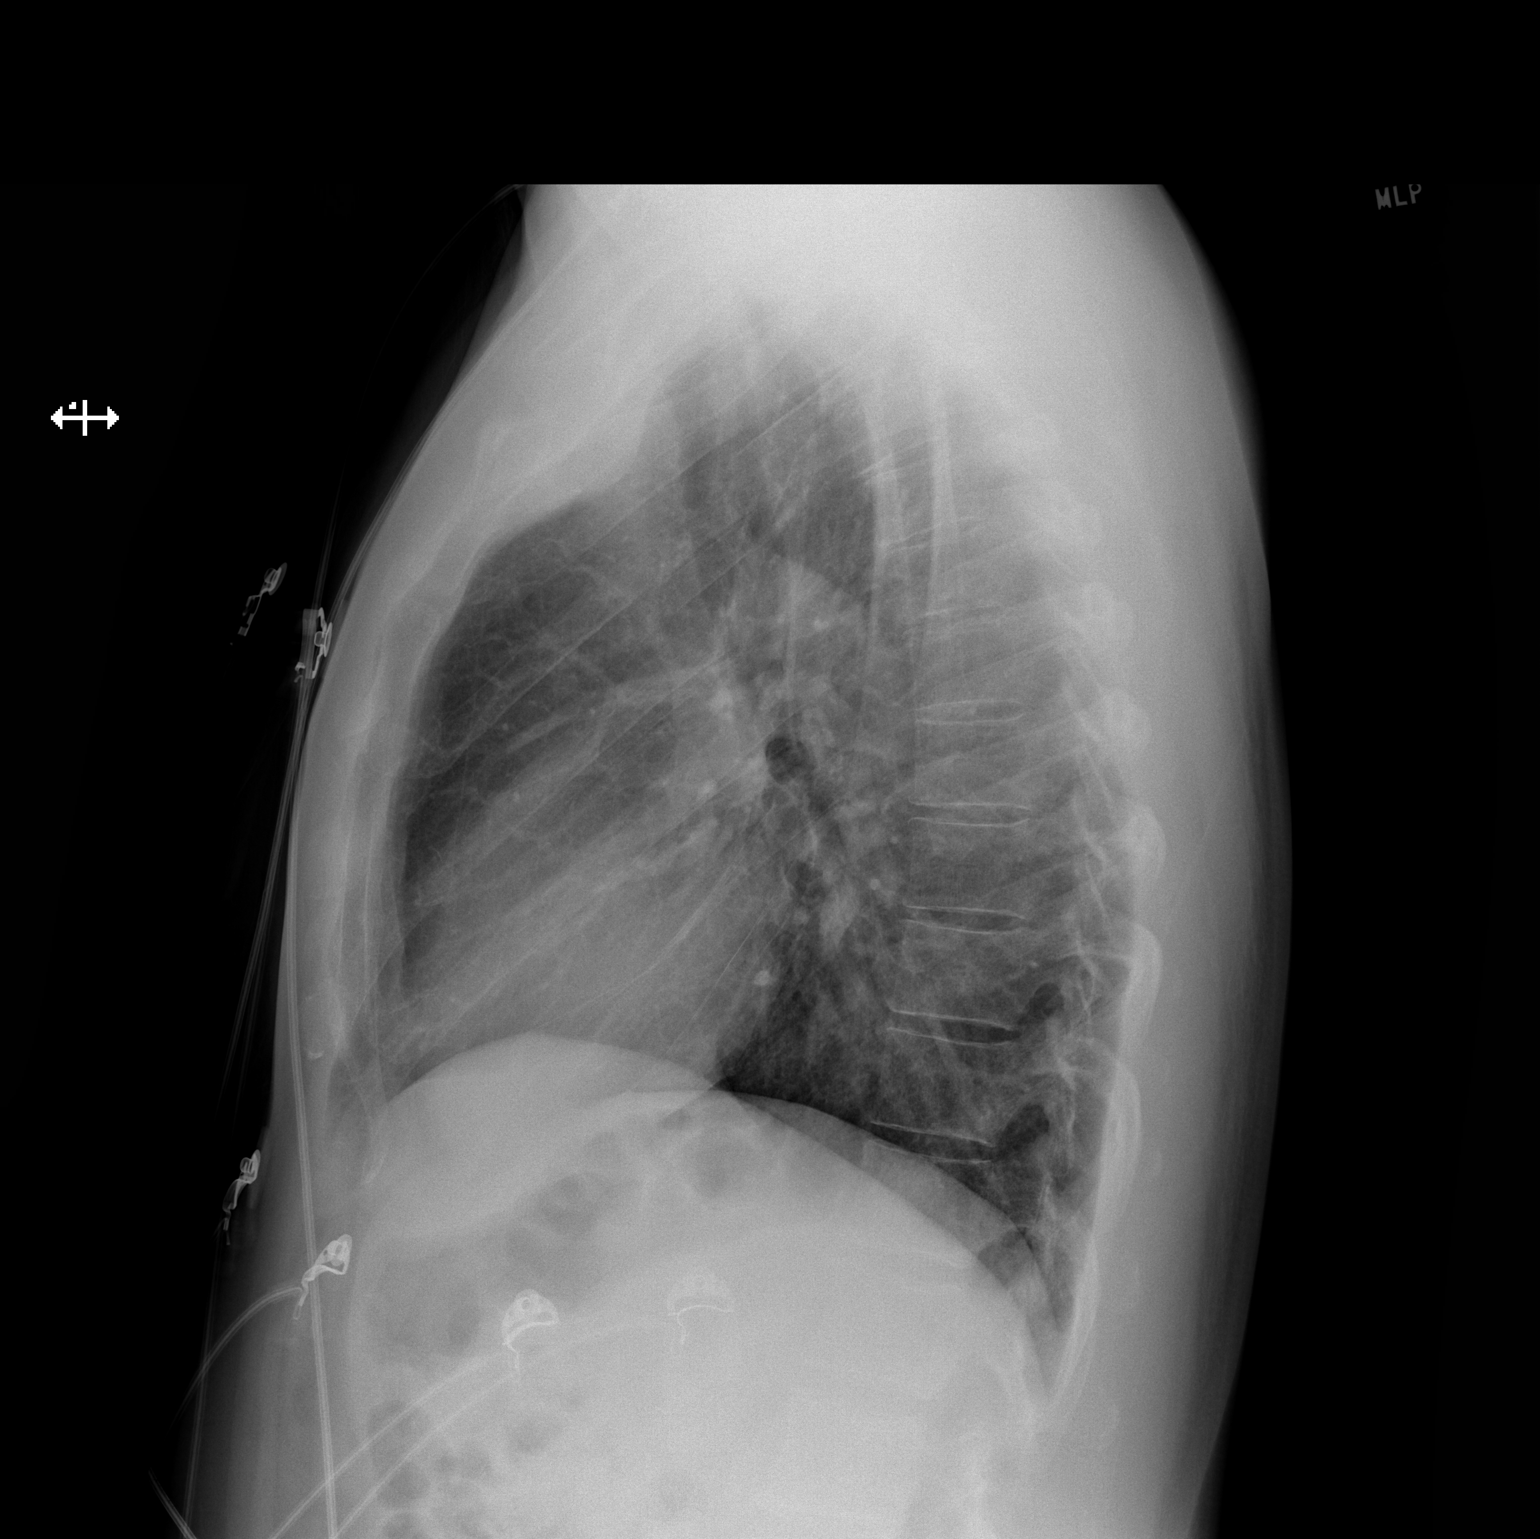

[2 of 2 positions shown; findings below may reference images not displayed]

FINDINGS: Heart and mediastinal contours are within normal limits. No focal
opacities or effusions. No acute bony abnormality.
IMPRESSION: No active cardiopulmonary disease.
